# Patient Record
Sex: Male | Born: 1955 | Race: White | Hispanic: No | Marital: Married | Smoking: Never smoker
Health system: Southern US, Community
[De-identification: ages and names within clinical notes are randomized; demographics above are authoritative.]

## PROBLEM LIST (undated history)

## (undated) DIAGNOSIS — R972 Elevated prostate specific antigen [PSA]: Secondary | ICD-10-CM

## (undated) HISTORY — DX: Elevated prostate specific antigen (PSA): R97.20

## (undated) NOTE — Addendum Note (Signed)
 Addended by: BREEDLOVE, APRIL DAWN on: 08/27/2022 12:56 PM   Modules accepted: Orders   Electronically signed by April Stephane Sever, Tech at 08/27/2022 12:56 PM EDT

---

## 2010-04-05 ENCOUNTER — Ambulatory Visit: Payer: Self-pay | Admitting: Internal Medicine

## 2010-06-21 ENCOUNTER — Ambulatory Visit: Payer: Self-pay | Admitting: Urology

## 2010-12-23 ENCOUNTER — Ambulatory Visit: Payer: Self-pay | Admitting: Urology

## 2010-12-28 ENCOUNTER — Ambulatory Visit: Payer: Self-pay | Admitting: Urology

## 2011-07-09 ENCOUNTER — Ambulatory Visit: Payer: Self-pay | Admitting: Urology

## 2011-12-07 IMAGING — CT CT ABDOMEN W/ CM
2 of 3 series · 13 of 32 positions shown, 19 images · IV contrast (isovue)
Comparison: none

REASON FOR EXAM: Nephrolithiasis With Delays
COMMENTS:

PROCEDURE:     CT  - CT ABDOMEN STANDARD W  - June 21, 2010  [DATE]
RESULT:
TECHNIQUE: Helical 3 mm sections were obtained from the lung bases through
the superior iliac crest status post intravenous administration of 100 ml of
Isovue 370. Also included are delayed images. This study was compared to a
previous study dated 04/05/2010.

[Series 2: soft tissue · axial · 0.78mm/px · z∈[-640,-400]mm · 11 of 96 slices shown, 17 images]
[im 8/96  soft-tissue]
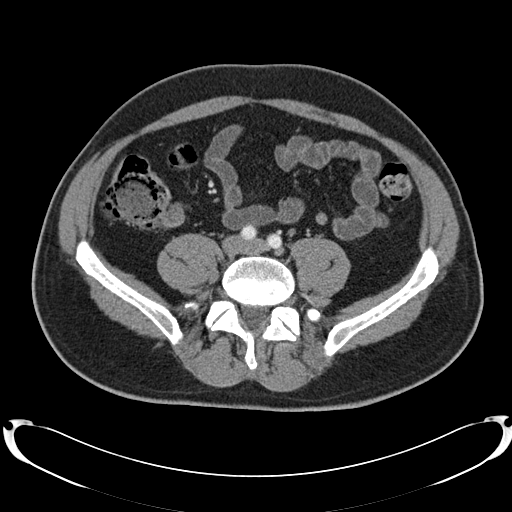
[im 8/96  bone]
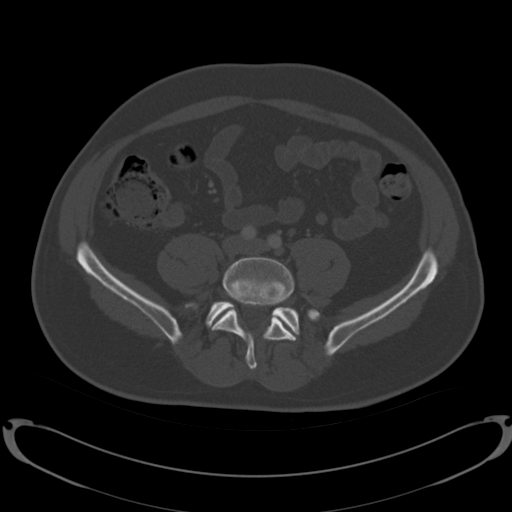
[im 16/96  soft-tissue]
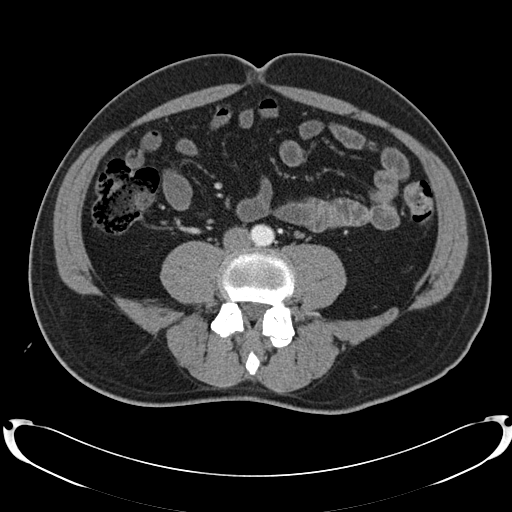
[im 24/96  soft-tissue]
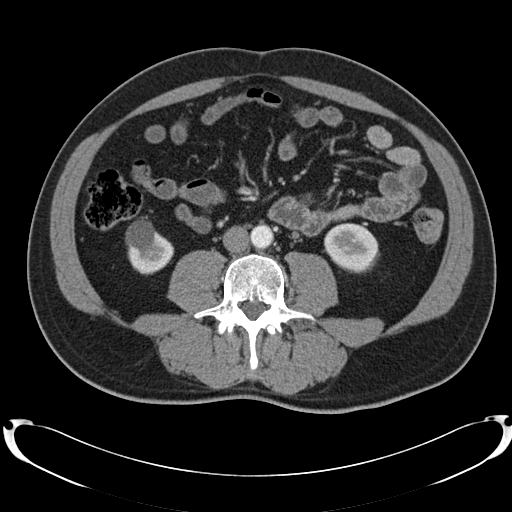
[im 32/96  soft-tissue]
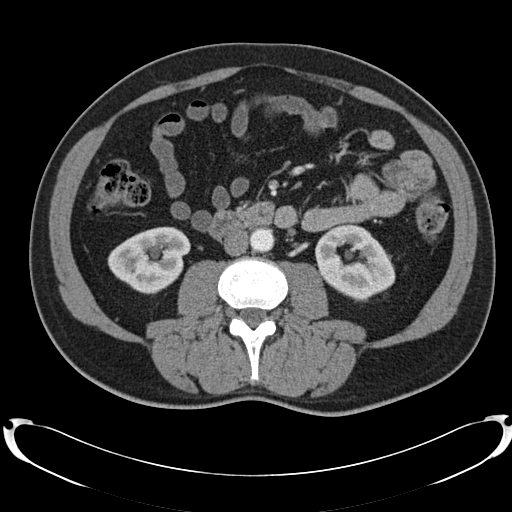
[im 40/96  soft-tissue]
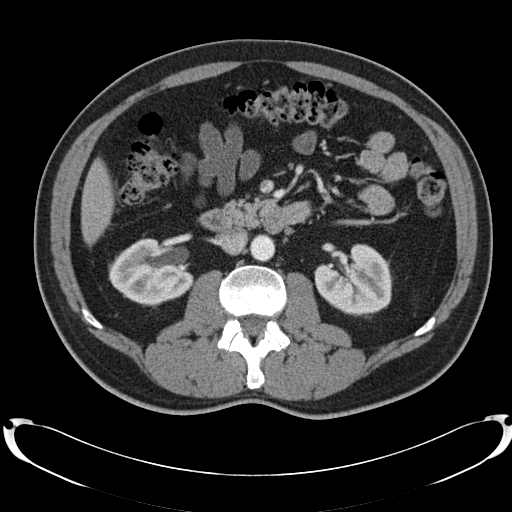
[im 48/96  soft-tissue]
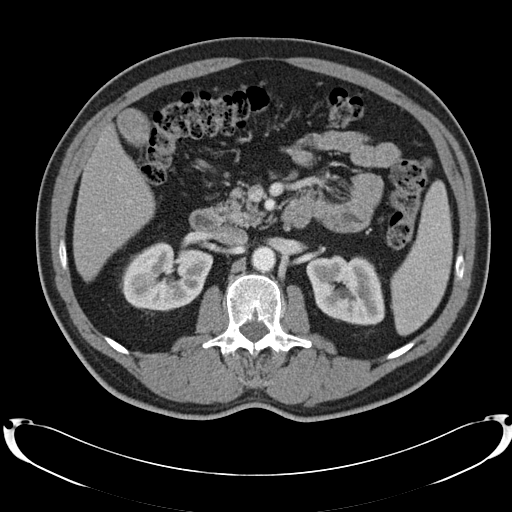
[im 56/96  soft-tissue]
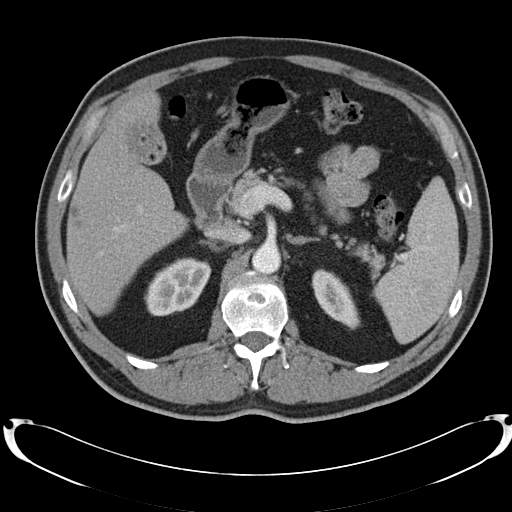
[im 64/96  soft-tissue]
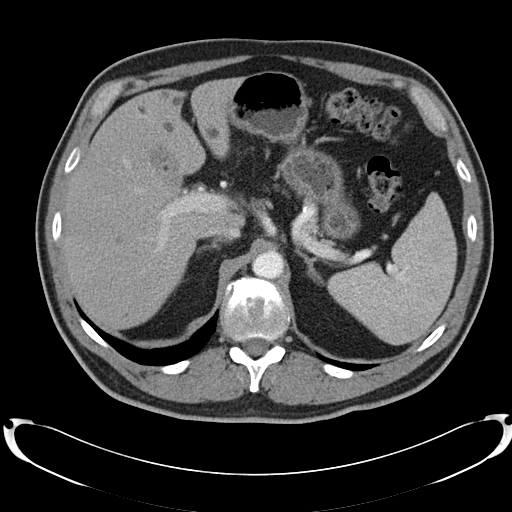
[im 64/96  lung]
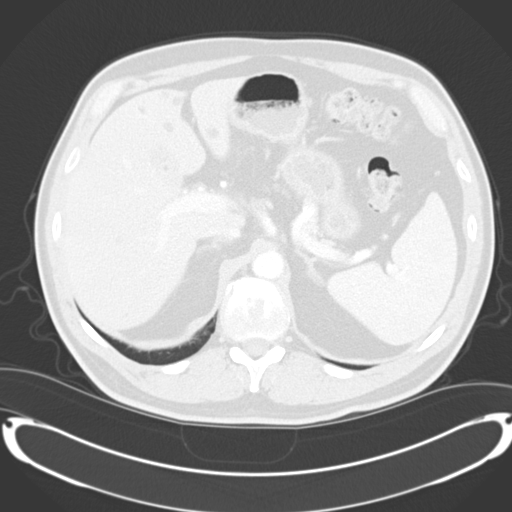
[im 72/96  soft-tissue]
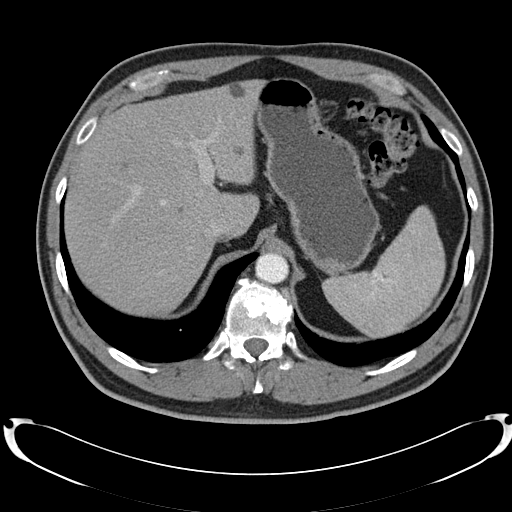
[im 72/96  lung]
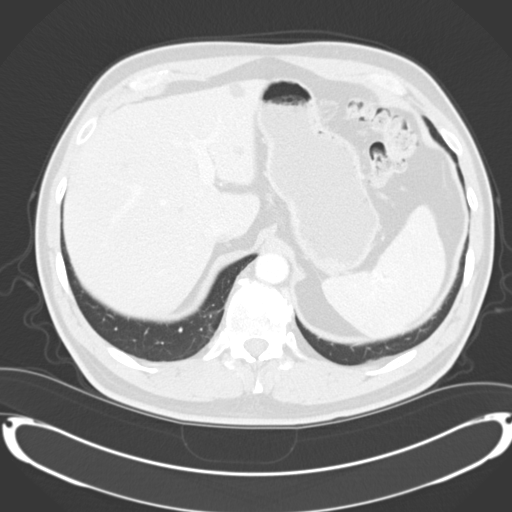
[im 72/96  bone]
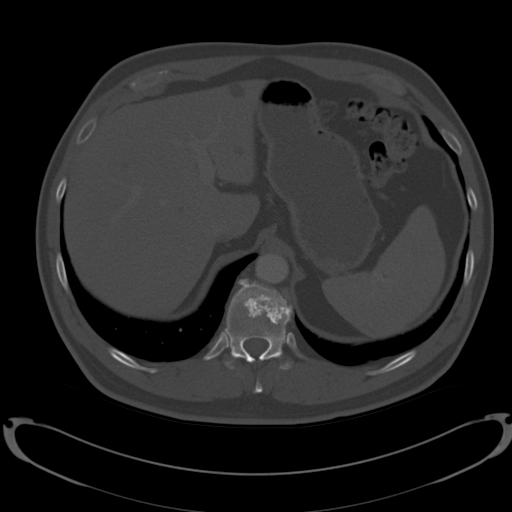
[im 80/96  soft-tissue]
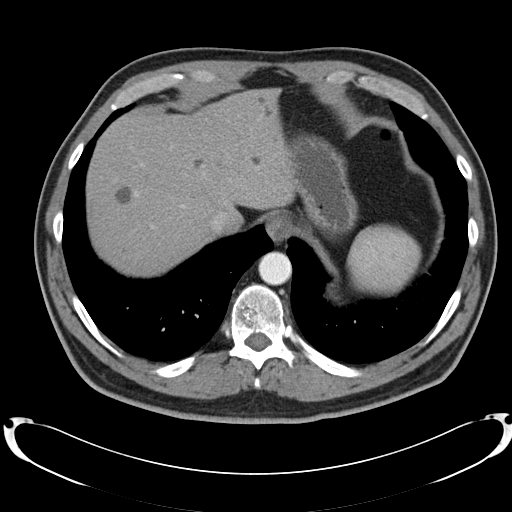
[im 80/96  lung]
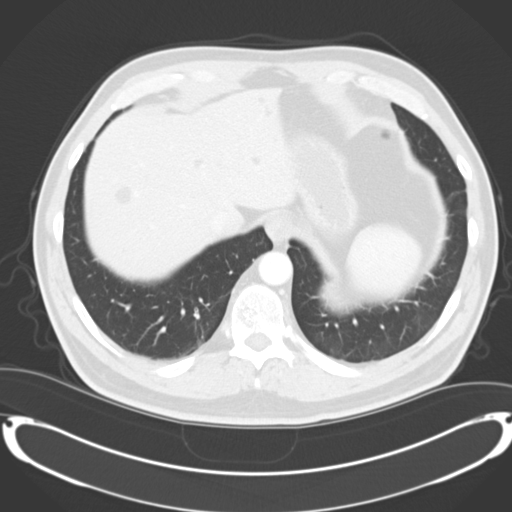
[im 88/96  soft-tissue]
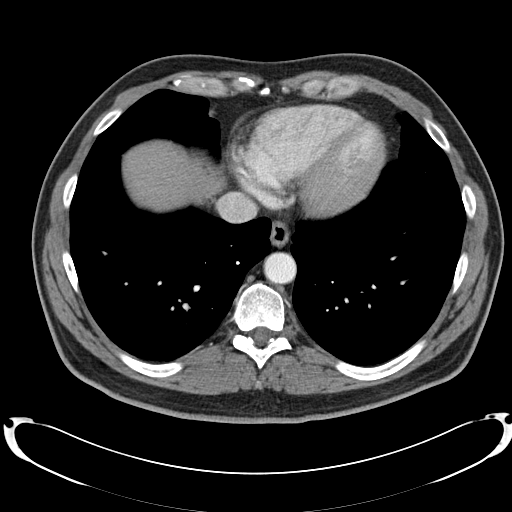
[im 88/96  lung]
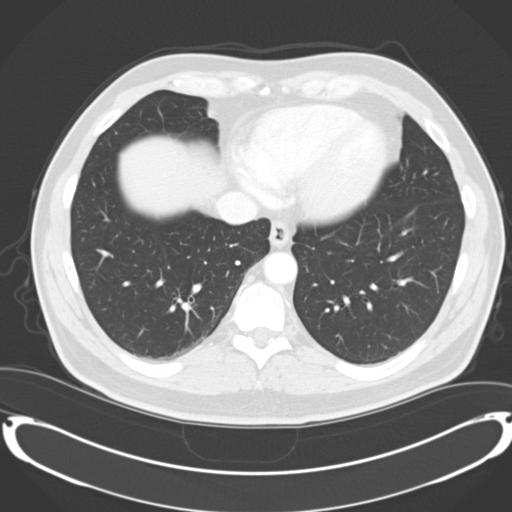

[Series 4: lung · axial · 0.78mm/px · z∈[-481,-454]mm · 2 of 44 slices shown]
[im 9/44  bone]
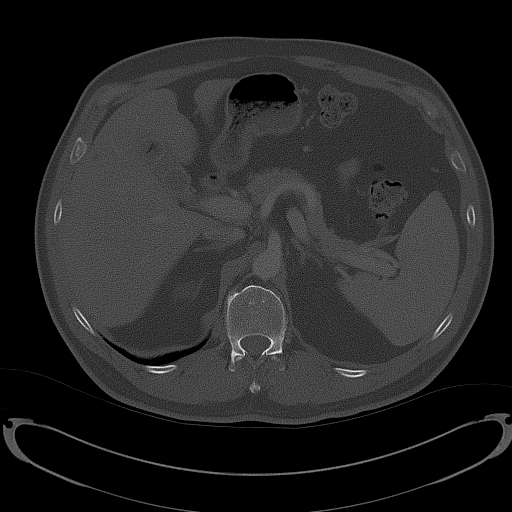
[im 18/44  bone]
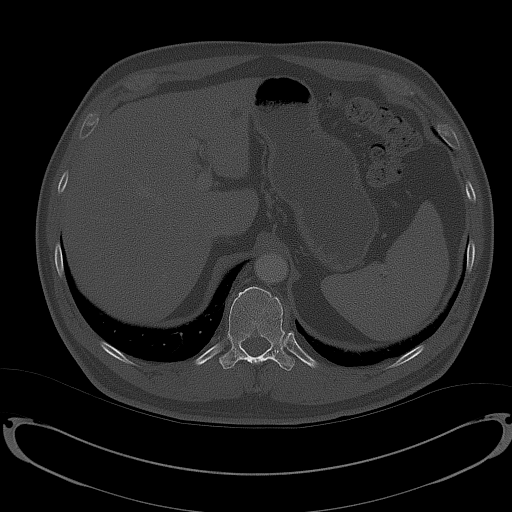

[13 of 32 positions shown; findings below may reference images not displayed]

FINDINGS: Evaluation of the lung bases demonstrates no gross abnormalities.
Evaluation of the liver demonstrates multiple low attenuating foci scattered
throughout the liver which are stable likely representing multiple cysts.
Partially calcified gallstone is identified within the gallbladder. The
spleen, adrenals, and pancreas is unremarkable. Evaluation of the kidneys
demonstrates a stable, low attenuating focus along the anterior lower pole
of the right kidney likely representing a cyst. There is no evidence of soft
tissue masses, hydronephrosis, hydroureter, nor evidence of nephrolithiasis
or ureterolithiasis within the visualized portion of the ureters. There is
no CT evidence of bowel obstruction, enteritis, colitis or evidence of
abdominal masses, free fluid or loculated fluid collections.
IMPRESSION: Stable cyst, right kidney, otherwise no CT evidence of
obstructive or inflammatory abnormalities.

## 2014-02-13 ENCOUNTER — Ambulatory Visit: Payer: Self-pay | Admitting: Ophthalmology

## 2014-08-19 NOTE — Op Note (Signed)
PATIENT NAME:  Thomas Hoffman, Thomas Hoffman MR#:  [id number] DATE OF BIRTH:  12-11-1955  DATE OF PROCEDURE:  02/13/2014  PREOPERATIVE DIAGNOSIS: Cataract, right eye.   POSTOPERATIVE DIAGNOSIS: Cataract, right eye.   PROCEDURE PERFORMED: Extracapsular cataract extraction using phacoemulsification with placement of Alcon [id number], 19.5 diopter posterior chamber lens, serial number [id number].   SURGEON:  Maylon PeppersSteven A. Kaylon Hitz, MD  ASSISTANT:  None.  ANESTHESIA: 4% lidocaine with 0.75% Marcaine, a 50-50 mixture with 10 units/mL of Hylenex added, given as a peribulbar.   ANESTHESIOLOGIST: Dr. Dimple Caseyice.   COMPLICATIONS: None.   ESTIMATED BLOOD LOSS: Less than 1 mL.   DESCRIPTION OF PROCEDURE:  The patient was brought to the operating room and given a peribulbar block.  The patient was then prepped and draped in the usual fashion.  The vertical rectus muscles were imbricated using 5-0 silk sutures.  These sutures were then clamped to the sterile drapes as bridle sutures.  A limbal peritomy was performed extending two clock hours and hemostasis was obtained with cautery.  A partial thickness scleral groove was made at the surgical limbus and dissected anteriorly in a lamellar dissection using an Alcon crescent knife.  The anterior chamber was entered superonasally with a Superblade and through the lamellar dissection with a 2.6 mm keratome.  DisCoVisc was used to replace the aqueous and a continuous tear capsulorrhexis was carried out.  Hydrodissection and hydrodelineation were carried out with balanced salt and a 27 gauge canula.  The nucleus was rotated to confirm the effectiveness of the hydrodissection.  Phacoemulsification was carried out using a divide-and-conquer technique.  Total ultrasound time was zero minutes and 46 seconds with an average power of 20.4 percent. SCD of 17.52. No suture was placed.   Irrigation/aspiration was used to remove the residual cortex.  DisCoVisc was used to inflate the capsule and  the internal incision was enlarged to 3 mm with the crescent knife.  The intraocular lens was folded and inserted into the capsular bag using the Alcon AcrySert delivery system. Irrigation/aspiration was used to remove the residual DisCoVisc.  Miostat was injected into the anterior chamber through the paracentesis track to inflate the anterior chamber and induce miosis.  A 0.1 mL of cefuroxime was injected via the paracentesis tract containing 1 mg of drug. The wound was checked for leaks and none were found. The conjunctiva was closed with cautery and the bridle sutures were removed.  Two drops of 0.3% Vigamox were placed on the eye.   An eye shield was placed on the eye.  The patient was discharged to the recovery room in good condition.   ____________________________ Maylon PeppersSteven A. Krystel Fletchall, MD sad:lt D: 02/13/2014 13:21:17 ET T: 02/13/2014 15:17:51 ET JOB#: [id number]  cc: Viviann SpareSteven A. Rayven Rettig, MD, <Dictator> Erline LevineSTEVEN A Jadyn Barge MD ELECTRONICALLY SIGNED 02/20/2014 12:38

## 2015-11-13 DIAGNOSIS — N281 Cyst of kidney, acquired: Secondary | ICD-10-CM | POA: Insufficient documentation

## 2015-11-13 DIAGNOSIS — K7689 Other specified diseases of liver: Secondary | ICD-10-CM | POA: Insufficient documentation

## 2015-11-14 DIAGNOSIS — D696 Thrombocytopenia, unspecified: Secondary | ICD-10-CM | POA: Insufficient documentation

## 2015-12-10 ENCOUNTER — Ambulatory Visit: Payer: BLUE CROSS/BLUE SHIELD | Admitting: Urology

## 2015-12-10 ENCOUNTER — Encounter: Payer: Self-pay | Admitting: Urology

## 2015-12-10 DIAGNOSIS — N200 Calculus of kidney: Secondary | ICD-10-CM | POA: Insufficient documentation

## 2015-12-10 DIAGNOSIS — R310 Gross hematuria: Secondary | ICD-10-CM | POA: Insufficient documentation

## 2015-12-10 DIAGNOSIS — Z125 Encounter for screening for malignant neoplasm of prostate: Secondary | ICD-10-CM | POA: Insufficient documentation

## 2016-04-14 ENCOUNTER — Encounter: Payer: Self-pay | Admitting: Urology

## 2016-04-14 ENCOUNTER — Ambulatory Visit: Payer: BLUE CROSS/BLUE SHIELD | Admitting: Urology

## 2016-04-14 VITALS — BP 149/82 | HR 62 | Ht 72.0 in | Wt 211.7 lb

## 2016-04-14 DIAGNOSIS — N200 Calculus of kidney: Secondary | ICD-10-CM

## 2016-04-14 DIAGNOSIS — Z125 Encounter for screening for malignant neoplasm of prostate: Secondary | ICD-10-CM | POA: Diagnosis not present

## 2016-04-14 DIAGNOSIS — R310 Gross hematuria: Secondary | ICD-10-CM

## 2016-04-14 LAB — URINALYSIS, COMPLETE
Bilirubin, UA: NEGATIVE
Glucose, UA: NEGATIVE
LEUKOCYTES UA: NEGATIVE
Nitrite, UA: NEGATIVE
PH UA: 5 (ref 5.0–7.5)
PROTEIN UA: NEGATIVE
Specific Gravity, UA: >1.03 — ABNORMAL HIGH (ref 1.005–1.030)
Urobilinogen, Ur: 0.2 mg/dL (ref 0.2–1.0)

## 2016-04-14 LAB — MICROSCOPIC EXAMINATION: EPITHELIAL CELLS (NON RENAL): NONE SEEN /HPF (ref 0–10)

## 2016-04-14 NOTE — Progress Notes (Signed)
04/14/2016 12:38 PM   Thomas Hoffman 07-06-1955 [id number]  Referring provider: Lynnea FerrierBert J Klein III, MD [address] [facility] [name]  CC: new patient with gross hematuria, nephrolithiasis, prostate screening  HPI:  1. Hematuria, gross - single episode at time of stone passage 2017. Had CT and cysto in [place], this has not been recurrent. Non snmoker.   2. Nephrolithiasis - ureteroscopy 07/2015 in [place] for 10mm UPJ stone. Had stent removal as well in [place]. No prior stones. Was told he is "stone free" following.    3. Prostate cancer screening - No FHX prostate cancer. 03/2016 DRE 30gm smooth / PSA (normal this year by PCP labs per pt)  PMH sig for basal cell skin CA, appy. No CV disease / blood thinners. 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  His [facility]CP is Thomas NonesBert Klein [facility]  Today "Thomas Hoffman" is seen as new patient for above. He is referred by Dr Thomas HusbandsKlein.   PMH: No past medical history on file.  Surgical History: No past surgical history on file.  Home Medications:  Allergies as of 04/14/2016   Not on File     Medication List    as of 04/14/2016 12:38 PM   You have not been prescribed any medications.     Allergies: Allergies not on file  Family History: No family history on file.  Social History:  has no tobacco, alcohol, and drug history on file.    Review of Systems  Gastrointestinal (upper)  : Negative for upper GI symptoms  Gastrointestinal (lower) : Negative for lower GI symptoms  Constitutional : Negative for symptoms  Skin: Negative for skin symptoms  Eyes: Negative for eye symptoms  Ear/Nose/Throat : Negative for Ear/Nose/Throat symptoms  Hematologic/Lymphatic: Negative for Hematologic/Lymphatic symptoms  Cardiovascular : Negative for cardiovascular symptoms  Respiratory : Negative for respiratory symptoms  Endocrine: Negative for endocrine  symptoms  Musculoskeletal: Negative for musculoskeletal symptoms  Neurological: Negative for neurological symptoms  Psychologic: Negative for psychiatric symptoms    Physical Exam: There were no vitals taken for this visit.  Constitutional:  Alert and oriented, No acute distress. HEENT: [place] AT, moist mucus membranes.  Trachea midline, no masses. Cardiovascular: No clubbing, cyanosis, or edema. Respiratory: Normal respiratory effort, no increased work of breathing. GI: Abdomen is soft, nontender, nondistended, no abdominal masses GU: No CVA tenderness. DRE 30gm smooth. No scrotal masses.  Skin: No rashes, bruises or suspicious lesions. Lymph: No cervical or inguinal adenopathy. Neurologic: Grossly intact, no focal deficits, moving all 4 extremities. Psychiatric: Normal mood and affect.  Laboratory Data: No results found for: WBC, HGB, HCT, MCV, PLT  No results found for: CREATININE  No results found for: PSA  No results found for: TESTOSTERONE  No results found for: HGBA1C  Urinalysis No results found for: COLORURINE, APPEARANCEUR, LABSPEC, PHURINE, GLUCOSEU, HGBUR, BILIRUBINUR, KETONESUR, PROTEINUR, UROBILINOGEN, NITRITE, LEUKOCYTESUR  Pertinent Imaging: none to review  Assessment & Plan:    1. Hematuria, gross - low risk and from prior nephrolithiasis. STORNGLY reinforced importance of repeat eval should this be recurrent.   2. Nephrolithiasis - discussed future recurrence risk approx 50% after single episode colic. He has no planned 3rd world travel. Discussed that I typically do not recommend exhaustive metabolic eval and annual surveillance after single episode colic, but we would proceed with much more aggressive approach should this become recurrent. We both agree on no further eval at this time.  3. Prostate cancer screening - DRE today reassuring and PSA normal this year per report, continue until age 61 or so as average risk.    Thomas AcheMANNY, Thomas Farewell,  MD  [facility] [address], [address] [place], [place] [address] [phone]

## 2018-01-21 ENCOUNTER — Ambulatory Visit: Payer: Self-pay

## 2018-01-21 DIAGNOSIS — Z0000 Encounter for general adult medical examination without abnormal findings: Secondary | ICD-10-CM

## 2018-03-09 ENCOUNTER — Ambulatory Visit: Payer: Self-pay | Admitting: Medical

## 2018-03-09 ENCOUNTER — Encounter: Payer: Self-pay | Admitting: Medical

## 2018-03-09 VITALS — BP 144/84 | HR 90 | Temp 97.5°F | Resp 18 | Wt 214.6 lb

## 2018-03-09 DIAGNOSIS — R05 Cough: Secondary | ICD-10-CM

## 2018-03-09 DIAGNOSIS — J0110 Acute frontal sinusitis, unspecified: Secondary | ICD-10-CM

## 2018-03-09 DIAGNOSIS — R059 Cough, unspecified: Secondary | ICD-10-CM

## 2018-03-09 MED ORDER — AMOXICILLIN-POT CLAVULANATE 875-125 MG PO TABS: 1.0000 | ORAL_TABLET | Freq: Two times a day (BID) | ORAL | 0 refills | Status: DC

## 2018-03-09 MED ORDER — BENZONATATE 100 MG PO CAPS: 100.0000 mg | ORAL_CAPSULE | Freq: Three times a day (TID) | ORAL | 0 refills | Status: DC | PRN

## 2018-03-09 NOTE — Patient Instructions (Signed)
Amoxicillin; Clavulanic Acid tablets What is this medicine? AMOXICILLIN; CLAVULANIC ACID (a mox i SIL in; KLAV yoo lan ic AS id) is a penicillin antibiotic. It is used to treat certain kinds of bacterial infections. It will not work for colds, flu, or other viral infections. This medicine may be used for other purposes; ask your health care provider or pharmacist if you have questions. COMMON BRAND NAME(S): Augmentin What should I tell my health care provider before I take this medicine? They need to know if you have any of these conditions: -bowel disease, like colitis -kidney disease -liver disease -mononucleosis -an unusual or allergic reaction to amoxicillin, penicillin, cephalosporin, other antibiotics, clavulanic acid, other medicines, foods, dyes, or preservatives -pregnant or trying to get pregnant -breast-feeding How should I use this medicine? Take this medicine by mouth with a full glass of water. Follow the directions on the prescription label. Take at the start of a meal. Do not crush or chew. If the tablet has a score line, you may cut it in half at the score line for easier swallowing. Take your medicine at regular intervals. Do not take your medicine more often than directed. Take all of your medicine as directed even if you think you are better. Do not skip doses or stop your medicine early. Talk to your pediatrician regarding the use of this medicine in children. Special care may be needed. Overdosage: If you think you have taken too much of this medicine contact a poison control center or emergency room at once. NOTE: This medicine is only for you. Do not share this medicine with others. What if I miss a dose? If you miss a dose, take it as soon as you can. If it is almost time for your next dose, take only that dose. Do not take double or extra doses. What may interact with this medicine? -allopurinol -anticoagulants -birth control pills -methotrexate -probenecid This  list may not describe all possible interactions. Give your health care provider a list of all the medicines, herbs, non-prescription drugs, or dietary supplements you use. Also tell them if you smoke, drink alcohol, or use illegal drugs. Some items may interact with your medicine. What should I watch for while using this medicine? Tell your doctor or health care professional if your symptoms do not improve. Do not treat diarrhea with over the counter products. Contact your doctor if you have diarrhea that lasts more than 2 days or if it is severe and watery. If you have diabetes, you may get a false-positive result for sugar in your urine. Check with your doctor or health care professional. Birth control pills may not work properly while you are taking this medicine. Talk to your doctor about using an extra method of birth control. What side effects may I notice from receiving this medicine? Side effects that you should report to your doctor or health care professional as soon as possible: -allergic reactions like skin rash, itching or hives, swelling of the face, lips, or tongue -breathing problems -dark urine -fever or chills, sore throat -redness, blistering, peeling or loosening of the skin, including inside the mouth -seizures -trouble passing urine or change in the amount of urine -unusual bleeding, bruising -unusually weak or tired -white patches or sores in the mouth or throat Side effects that usually do not require medical attention (report to your doctor or health care professional if they continue or are bothersome): -diarrhea -dizziness -headache -nausea, vomiting -stomach upset -vaginal or anal irritation This list may   not describe all possible side effects. Call your doctor for medical advice about side effects. You may report side effects to FDA at 1-800-FDA-1088. Where should I keep my medicine? Keep out of the reach of children. Store at room temperature below 25 degrees  C (77 degrees F). Keep container tightly closed. Throw away any unused medicine after the expiration date. NOTE: This sheet is a summary. It may not cover all possible information. If you have questions about this medicine, talk to your doctor, pharmacist, or health care provider.  2018 Elsevier/Gold Standard (2007-07-08 12:04:30)  Sinusitis, Adult Sinusitis is soreness and inflammation of your sinuses. Sinuses are hollow spaces in the bones around your face. They are located:  Around your eyes.  In the middle of your forehead.  Behind your nose.  In your cheekbones.  Your sinuses and nasal passages are lined with a stringy fluid (mucus). Mucus normally drains out of your sinuses. When your nasal tissues get inflamed or swollen, the mucus can get trapped or blocked so air cannot flow through your sinuses. This lets bacteria, viruses, and funguses grow, and that leads to infection. Follow these instructions at home: Medicines  Take, use, or apply over-the-counter and prescription medicines only as told by your doctor. These may include nasal sprays.  If you were prescribed an antibiotic medicine, take it as told by your doctor. Do not stop taking the antibiotic even if you start to feel better. Hydrate and Humidify  Drink enough water to keep your pee (urine) clear or pale yellow.  Use a cool mist humidifier to keep the humidity level in your home above 50%.  Breathe in steam for 10-15 minutes, 3-4 times a day or as told by your doctor. You can do this in the bathroom while a hot shower is running.  Try not to spend time in cool or dry air. Rest  Rest as much as possible.  Sleep with your head raised (elevated).  Make sure to get enough sleep each night. General instructions  Put a warm, moist washcloth on your face 3-4 times a day or as told by your doctor. This will help with discomfort.  Wash your hands often with soap and water. If there is no soap and water, use hand  sanitizer.  Do not smoke. Avoid being around people who are smoking (secondhand smoke).  Keep all follow-up visits as told by your doctor. This is important. Contact a doctor if:  You have a fever.  Your symptoms get worse.  Your symptoms do not get better within 10 days. Get help right away if:  You have a very bad headache.  You cannot stop throwing up (vomiting).  You have pain or swelling around your face or eyes.  You have trouble seeing.  You feel confused.  Your neck is stiff.  You have trouble breathing. This information is not intended to replace advice given to you by your health care provider. Make sure you discuss any questions you have with your health care provider. Document Released: 10/01/2007 Document Revised: 12/09/2015 Document Reviewed: 02/07/2015 Elsevier Interactive Patient Education  2018 Elsevier Inc.  

## 2018-03-09 NOTE — Progress Notes (Signed)
   Subjective:    Patient ID: Thomas Hoffman, male    DOB: [birth year], 62 y.o.   MRN: [id number]  HPI 62 yo male in non acute distress. Comes in today with complaints of  10 days , sore throat, cough non productive, nasal congestion, runny nose mostly clear. Fever and Chills on/off, Denies shortness of breath and Chest pain with coughing. Cough keeping him up at night . Has  Tried Sudafed and Zyrtec.  Wife with the flu and and URI, last week, was treated with antibiotics.   Blood pressure (!) 144/84, pulse 90, temperature (!) 97.5 F (36.4 C), temperature source Tympanic, resp. rate 18, weight 214 lb 9.6 oz (97.3 kg), SpO2 98 %.  Review of Systems  Constitutional: Positive for chills, fatigue and fever (did not take).  HENT: Positive for congestion, postnasal drip, rhinorrhea, sinus pressure ("my temproral area bilaterally), sinus pain, sore throat and voice change. Negative for ear pain and sneezing.   Eyes: Negative for discharge and itching.  Respiratory: Positive for cough and chest tightness. Negative for shortness of breath and wheezing.   Cardiovascular: Positive for chest pain (with coughint).  Gastrointestinal: Negative for abdominal pain.  Endocrine: Negative for polydipsia, polyphagia and polyuria.  Genitourinary: Negative for dysuria.  Musculoskeletal: Negative for myalgias.  Skin: Negative for rash.  Allergic/Immunologic: Negative for environmental allergies and food allergies.  Neurological: Positive for headaches. Negative for dizziness, syncope and light-headedness.  Hematological: Negative for adenopathy.  Psychiatric/Behavioral: Negative for behavioral problems, confusion, self-injury and suicidal ideas.       Objective:   Physical Exam  Constitutional: Thomas Hoffman is oriented to person, place, and time. Thomas Hoffman appears well-developed and well-nourished.  HENT:  Head: Normocephalic and atraumatic.  Right Ear: Hearing, external ear and ear canal normal. A middle ear effusion is  present.  Left Ear: Hearing, external ear and ear canal normal. A middle ear effusion is present.  Nose: Mucosal edema and rhinorrhea present.  Mouth/Throat: Uvula is midline and mucous membranes are normal. Posterior oropharyngeal edema (mild erythema) present. Tonsils are 1+ on the left.  Eyes: Pupils are equal, round, and reactive to light. Conjunctivae and EOM are normal.  Neck: Normal range of motion. Neck supple.  Cardiovascular: Normal rate, regular rhythm and normal heart sounds.  Pulmonary/Chest: Effort normal and breath sounds normal.  Neurological: Thomas Hoffman is alert and oriented to person, place, and time.  Skin: Skin is warm and dry.  Psychiatric: Thomas Hoffman has a normal mood and affect. His behavior is normal. Judgment and thought content normal.  Nursing note and vitals reviewed.         Assessment & Plan:  Sinusitis Cough Pharyngitis Meds ordered this encounter  Medications  . amoxicillin-clavulanate (AUGMENTIN) 875-125 MG tablet    Sig: Take 1 tablet by mouth 2 (two) times daily.    Dispense:  20 tablet    Refill:  0  . benzonatate (TESSALON PERLES) 100 MG capsule    Sig: Take 1 capsule (100 mg total) by mouth 3 (three) times daily as needed.    Dispense:  30 capsule    Refill:  0  Rest , increase fluids, OTC Motrin or Tylenol as needed , take as directed. Salt water gargles 3 times per day.  OTC Flonase NS take as directed for post nasl drip and Eustachian tube dysfunction bilaterally. Return in 3-5 days if not improving or worsening. Patient declined AVS. Patient verbalizes understanding and has no questions at discharge. .Marland Kitchen

## 2018-03-19 ENCOUNTER — Ambulatory Visit: Payer: Self-pay | Admitting: Adult Health

## 2018-03-19 ENCOUNTER — Encounter: Payer: Self-pay | Admitting: Adult Health

## 2018-03-19 VITALS — BP 135/92 | HR 62 | Temp 98.1°F | Resp 16 | Ht 72.0 in | Wt 215.0 lb

## 2018-03-19 DIAGNOSIS — R059 Cough, unspecified: Secondary | ICD-10-CM

## 2018-03-19 DIAGNOSIS — J40 Bronchitis, not specified as acute or chronic: Secondary | ICD-10-CM

## 2018-03-19 DIAGNOSIS — K8020 Calculus of gallbladder without cholecystitis without obstruction: Secondary | ICD-10-CM | POA: Insufficient documentation

## 2018-03-19 DIAGNOSIS — E785 Hyperlipidemia, unspecified: Secondary | ICD-10-CM | POA: Insufficient documentation

## 2018-03-19 DIAGNOSIS — Z85828 Personal history of other malignant neoplasm of skin: Secondary | ICD-10-CM | POA: Insufficient documentation

## 2018-03-19 DIAGNOSIS — R3129 Other microscopic hematuria: Secondary | ICD-10-CM | POA: Insufficient documentation

## 2018-03-19 DIAGNOSIS — R05 Cough: Secondary | ICD-10-CM

## 2018-03-19 MED ORDER — PREDNISONE 10 MG (21) PO TBPK: ORAL_TABLET | ORAL | 0 refills | Status: DC

## 2018-03-19 MED ORDER — AZITHROMYCIN 250 MG PO TABS: ORAL_TABLET | ORAL | 0 refills | Status: DC

## 2018-03-19 NOTE — Progress Notes (Addendum)
Subjective:     Patient ID: Thomas Hoffman, male   DOB: [birth year], 62 y.o.   MRN: [id number]  HPI   Blood pressure (!) 135/92, pulse 62, temperature 98.1 F (36.7 C), resp. rate 16, height 6' (1.829 m), weight 215 lb (97.5 kg), SpO2 97 %. Taking Sudafed.    Patient is a 62 year old male in no acute distress here for follow up he reports he is feeling better than he was, but does have post nasal drip that continues and a new chest congestion.Since last office visit he has started Zyrtec and Flonase everyday. Mild fatigue.  He also completed Antibiotics Augmentin. He has still been using Tessalon pearles.   He continues to feel as if he has sinus pressure/ drainage but is improved from previously. Now has mild bronchial congestion and mild chest tightness. Coughing up yellow sputum- scant.  Mild fatigue.  Wife has had pneumonia x 2 in the past few months. She is a Engineer, civil (consulting)nurse. She was not hospitalized.   He denies smoking, vaping. Denies ever using tobacco products.   Patient  denies any fever, chills, rash, chest pain, shortness of breath, nausea, vomiting, or diarrhea.  Reports he sees his primary care regularly. Reviewed problem list and medications with patient.   Denies any edema.  Review of Systems  HENT: Positive for congestion, postnasal drip, rhinorrhea, sinus pressure and sore throat. Negative for sneezing.   Respiratory: Positive for cough and chest tightness. Negative for apnea, choking, shortness of breath, wheezing and stridor.   Cardiovascular: Negative.   Gastrointestinal: Negative.   Genitourinary: Negative.   Musculoskeletal: Negative.   Skin: Negative.   Neurological: Negative.   Hematological: Negative.   Psychiatric/Behavioral: Negative.        Objective:   Physical Exam  Constitutional: He is oriented to person, place, and time. He appears well-developed and well-nourished.  Patient is alert and oriented and responsive to questions Engages in eye contact with  provider. Speaks in full sentences without any pauses without any shortness of breath or distress.    HENT:  Head: Normocephalic and atraumatic.  Right Ear: Hearing, external ear and ear canal normal. Tympanic membrane is erythematous. Tympanic membrane is not perforated. A middle ear effusion is present.  Left Ear: Hearing, external ear and ear canal normal. Tympanic membrane is erythematous. Tympanic membrane is not perforated. A middle ear effusion is present.  Nose: Rhinorrhea present. No mucosal edema. Right sinus exhibits no maxillary sinus tenderness and no frontal sinus tenderness. Left sinus exhibits no maxillary sinus tenderness and no frontal sinus tenderness.  Mouth/Throat: Uvula is midline and mucous membranes are normal. No uvula swelling. Posterior oropharyngeal erythema present. No oropharyngeal exudate, posterior oropharyngeal edema or tonsillar abscesses. No tonsillar exudate.  Eyes: Pupils are equal, round, and reactive to light. Conjunctivae, EOM and lids are normal. Right eye exhibits no discharge. Left eye exhibits no discharge. No scleral icterus.  Neck: Trachea normal, normal range of motion, full passive range of motion without pain and phonation normal. Neck supple. Normal carotid pulses and no JVD present. No tracheal tenderness present. Carotid bruit is not present. No tracheal deviation present. No Brudzinski's sign noted. No thyromegaly present.  Cardiovascular: Normal rate, regular rhythm, normal heart sounds and intact distal pulses. Exam reveals no gallop and no friction rub.  No murmur heard. Pulmonary/Chest: Effort normal. No accessory muscle usage or stridor. No tachypnea and no bradypnea. No respiratory distress. He has no decreased breath sounds. He has wheezes (mild bilateral expiratory  wheeze ) in the right upper field and the left upper field. He has no rhonchi. He has no rales. He exhibits no tenderness.  bronchial congestion noted in room and by auscultation   Abdominal: Soft. Bowel sounds are normal.  Musculoskeletal: Normal range of motion.  Lymphadenopathy:       Head (right side): No submental, no submandibular, no tonsillar, no preauricular, no posterior auricular and no occipital adenopathy present.       Head (left side): No submental, no submandibular, no tonsillar, no preauricular, no posterior auricular and no occipital adenopathy present.    He has no cervical adenopathy.  Neurological: He is alert and oriented to person, place, and time.  Skin: Skin is warm and dry. Capillary refill takes less than 2 seconds. No rash noted. There is erythema. No pallor.  Vitals reviewed.      Assessment:     Cough - Plan: DG Chest 2 View  Acute non-recurrent frontal sinusitis  Bronchitis - Plan: DG Chest 2 View      Plan:     Meds ordered this encounter  Medications  . predniSONE (STERAPRED UNI-PAK 21 TAB) 10 MG (21) TBPK tablet    Sig: PO: Take 6 tablets on day 1:Take 5 tablets day 2:Take 4 tablets day 3: Take 3 tablets day 4:Take 2 tablets day five: 5 Take 1 tablet day 6    Dispense:  21 tablet    Refill:  0  . azithromycin (ZITHROMAX) 250 MG tablet    Sig: By mouth Take 2 tablets day 1 (500mg  total) and 1 tablet ( 250 mg ) on days 2,3,4,5.    Dispense:  6 tablet    Refill:  0   Will treat for atypical bacteria given wife recent history and hospital exposure - ordered chest x ray rule out pneumonia.   Keep record of blood pressure for your PCP. Avoid sudafed.  Return in about 1 week (around 03/26/2018) for Call 911 for emergencies, Go to Emergency room/ urgent care if worse.  Orders Placed This Encounter  Procedures  . DG Chest 2 View    Standing Status:   Future    Standing Expiration Date:   04/18/2018    Order Specific Question:   Reason for Exam (SYMPTOM  OR DIAGNOSIS REQUIRED)    Answer:   cough persistent rule out pneumonia since 03/09/18    Order Specific Question:   Preferred imaging location?    Answer:   [facility]    Order Specific Question:   Call Results- Best Contact Number?    Answer:   [phone]    Order Specific Question:   Radiology Contrast Protocol - do NOT remove file path    Answer:   [email or web address]   Schedule follow up appointment with your primary care for your annual physical and labs when due and follow up.   Advised patient call the office or your primary care doctor for an appointment if no improvement within 72 hours or if any symptoms change or worsen at any time  Advised ER or urgent Care if after hours or on weekend. Call 911 for emergency symptoms at any time.Patinet verbalized understanding of all instructions given/reviewed and treatment plan and has no further questions or concerns at this time.    Patient verbalized understanding of all instructions given and denies any further questions at this time.

## 2018-03-19 NOTE — Patient Instructions (Signed)
Acute Bronchitis, Adult Acute bronchitis is when air tubes (bronchi) in the lungs suddenly get swollen. The condition can make it hard to breathe. It can also cause these symptoms:  A cough.  Coughing up clear, yellow, or green mucus.  Wheezing.  Chest congestion.  Shortness of breath.  A fever.  Body aches.  Chills.  A sore throat.  Follow these instructions at home: Medicines  Take over-the-counter and prescription medicines only as told by your doctor.  If you were prescribed an antibiotic medicine, take it as told by your doctor. Do not stop taking the antibiotic even if you start to feel better. General instructions  Rest.  Drink enough fluids to keep your pee (urine) clear or pale yellow.  Avoid smoking and secondhand smoke. If you smoke and you need help quitting, ask your doctor. Quitting will help your lungs heal faster.  Use an inhaler, cool mist vaporizer, or humidifier as told by your doctor.  Keep all follow-up visits as told by your doctor. This is important. How is this prevented? To lower your risk of getting this condition again:  Wash your hands often with soap and water. If you cannot use soap and water, use hand sanitizer.  Avoid contact with people who have cold symptoms.  Try not to touch your hands to your mouth, nose, or eyes.  Make sure to get the flu shot every year.  Contact a doctor if:  Your symptoms do not get better in 2 weeks. Get help right away if:  You cough up blood.  You have chest pain.  You have very bad shortness of breath.  You become dehydrated.  You faint (pass out) or keep feeling like you are going to pass out.  You keep throwing up (vomiting).  You have a very bad headache.  Your fever or chills gets worse. This information is not intended to replace advice given to you by your health care provider. Make sure you discuss any questions you have with your health care provider. Document Released:  10/01/2007 Document Revised: 11/21/2015 Document Reviewed: 10/03/2015 Elsevier Interactive Patient Education  2018 [employer]. Cough, Adult A cough helps to clear your throat and lungs. A cough may last only 2-3 weeks (acute), or it may last longer than 8 weeks (chronic). Many different things can cause a cough. A cough may be a sign of an illness or another medical condition. Follow these instructions at home:  Pay attention to any changes in your cough.  Take medicines only as told by your doctor. ? If you were prescribed an antibiotic medicine, take it as told by your doctor. Do not stop taking it even if you start to feel better. ? Talk with your doctor before you try using a cough medicine.  Drink enough fluid to keep your pee (urine) clear or pale yellow.  If the air is dry, use a cold steam vaporizer or humidifier in your home.  Stay away from things that make you cough at work or at home.  If your cough is worse at night, try using extra pillows to raise your head up higher while you sleep.  Do not smoke, and try not to be around smoke. If you need help quitting, ask your doctor.  Do not have caffeine.  Do not drink alcohol.  Rest as needed. Contact a doctor if:  You have new problems (symptoms).  You cough up yellow fluid (pus).  Your cough does not get better after 2-3 weeks, or   your cough gets worse.  Medicine does not help your cough and you are not sleeping well.  You have pain that gets worse or pain that is not helped with medicine.  You have a fever.  You are losing weight and you do not know why.  You have night sweats. Get help right away if:  You cough up blood.  You have trouble breathing.  Your heartbeat is very fast. This information is not intended to replace advice given to you by your health care provider. Make sure you discuss any questions you have with your health care provider. Document Released: 12/26/2010 Document Revised:  09/20/2015 Document Reviewed: 06/21/2014 Elsevier Interactive Patient Education  2018 [employer]. Otitis Media, Adult Otitis media is redness, soreness, and puffiness (swelling) in the space just behind your eardrum (middle ear). It may be caused by allergies or infection. It often happens along with a cold. Follow these instructions at home:  Take your medicine as told. Finish it even if you start to feel better.  Only take over-the-counter or prescription medicines for pain, discomfort, or fever as told by your doctor.  Follow up with your doctor as told. Contact a doctor if:  You have otitis media only in one ear, or bleeding from your nose, or both.  You notice a lump on your neck.  You are not getting better in 3-5 days.  You feel worse instead of better. Get help right away if:  You have pain that is not helped with medicine.  You have puffiness, redness, or pain around your ear.  You get a stiff neck.  You cannot move part of your face (paralysis).  You notice that the bone behind your ear hurts when you touch it. This information is not intended to replace advice given to you by your health care provider. Make sure you discuss any questions you have with your health care provider. Document Released: 10/01/2007 Document Revised: 09/20/2015 Document Reviewed: 11/09/2012 Elsevier Interactive Patient Education  2017 Else[employer] Inc. Azithromycin tablets What is this medicine? AZITHROMYCIN (az ith roe MYE sin) is a macrolide antibiotic. It is used to treat or prevent certain kinds of bacterial infections. It will not work for colds, flu, or other viral infections. This medicine may be used for other purposes; ask your health care provider or pharmacist if you have questions. COMMON BRAND NAME(S): Zithromax, Zithromax Tri-Pak, Zithromax Z-Pak What should I tell my health care provider before I take this medicine? They need to know if you have any of these  conditions: -kidney disease -liver disease -irregular heartbeat or heart disease -an unusual or allergic reaction to azithromycin, erythromycin, other macrolide antibiotics, foods, dyes, or preservatives -pregnant or trying to get pregnant -breast-feeding How should I use this medicine? Take this medicine by mouth with a full glass of water. Follow the directions on the prescription label. The tablets can be taken with food or on an empty stomach. If the medicine upsets your stomach, take it with food. Take your medicine at regular intervals. Do not take your medicine more often than directed. Take all of your medicine as directed even if you think your are better. Do not skip doses or stop your medicine early. Talk to your pediatrician regarding the use of this medicine in children. While this drug may be prescribed for children as young as 6 months for selected conditions, precautions do apply. Overdosage: If you think you have taken too much of this medicine contact a poison control center or  emergency room at once. NOTE: This medicine is only for you. Do not share this medicine with others. What if I miss a dose? If you miss a dose, take it as soon as you can. If it is almost time for your next dose, take only that dose. Do not take double or extra doses. What may interact with this medicine? Do not take this medicine with any of the following medications: -lincomycin This medicine may also interact with the following medications: -amiodarone -antacids -birth control pills -cyclosporine -digoxin -magnesium -nelfinavir -phenytoin -warfarin This list may not describe all possible interactions. Give your health care provider a list of all the medicines, herbs, non-prescription drugs, or dietary supplements you use. Also tell them if you smoke, drink alcohol, or use illegal drugs. Some items may interact with your medicine. What should I watch for while using this medicine? Tell your  doctor or healthcare professional if your symptoms do not start to get better or if they get worse. Do not treat diarrhea with over the counter products. Contact your doctor if you have diarrhea that lasts more than 2 days or if it is severe and watery. This medicine can make you more sensitive to the sun. Keep out of the sun. If you cannot avoid being in the sun, wear protective clothing and use sunscreen. Do not use sun lamps or tanning beds/booths. What side effects may I notice from receiving this medicine? Side effects that you should report to your doctor or health care professional as soon as possible: -allergic reactions like skin rash, itching or hives, swelling of the face, lips, or tongue -confusion, nightmares or hallucinations -dark urine -difficulty breathing -hearing loss -irregular heartbeat or chest pain -pain or difficulty passing urine -redness, blistering, peeling or loosening of the skin, including inside the mouth -white patches or sores in the mouth -yellowing of the eyes or skin Side effects that usually do not require medical attention (report to your doctor or health care professional if they continue or are bothersome): -diarrhea -dizziness, drowsiness -headache -stomach upset or vomiting -tooth discoloration -vaginal irritation This list may not describe all possible side effects. Call your doctor for medical advice about side effects. You may report side effects to FDA at 1-800-FDA-1088. Where should I keep my medicine? Keep out of the reach of children. Store at room temperature between 15 and 30 degrees C (59 and 86 degrees F). Throw away any unused medicine after the expiration date. NOTE: This sheet is a summary. It may not cover all possible information. If you have questions about this medicine, talk to your doctor, pharmacist, or health care provider.  2018 Elsevier/Gold Standard (2015-06-12 15:26:03) Prednisolone tablets What is this  medicine? PREDNISOLONE (pred NISS oh lone) is a corticosteroid. It is commonly used to treat inflammation of the skin, joints, lungs, and other organs. Common conditions treated include asthma, allergies, and arthritis. It is also used for other conditions, such as blood disorders and diseases of the adrenal glands. This medicine may be used for other purposes; ask your health care provider or pharmacist if you have questions. COMMON BRAND NAME(S): Millipred, Millipred DP, Millipred DP 12-Day, Millipred DP 6 Day, Prednoral What should I tell my health care provider before I take this medicine? They need to know if you have any of these conditions: -Cushing's syndrome -diabetes -glaucoma -heart problems or disease -high blood pressure -infection such as herpes, measles, tuberculosis, or chickenpox -kidney disease -liver disease -mental problems -myasthenia gravis -osteoporosis -seizures -stomach ulcer  or intestine disease including colitis and diverticulitis -thyroid problem -an unusual or allergic reaction to lactose, prednisolone, other medicines, foods, dyes, or preservatives -pregnant or trying to get pregnant -breast-feeding How should I use this medicine? Take this medicine by mouth with a glass of water. Follow the directions on the prescription label. Take it with food or milk to avoid stomach upset. If you are taking this medicine once a day, take it in the morning. Do not take more medicine than you are told to take. Do not suddenly stop taking your medicine because you may develop a severe reaction. Your doctor will tell you how much medicine to take. If your doctor wants you to stop the medicine, the dose may be slowly lowered over time to avoid any side effects. Talk to your pediatrician regarding the use of this medicine in children. Special care may be needed. Overdosage: If you think you have taken too much of this medicine contact a poison control center or emergency room at  once. NOTE: This medicine is only for you. Do not share this medicine with others. What if I miss a dose? If you miss a dose, take it as soon as you can. If it is almost time for your next dose, take only that dose. Do not take double or extra doses. What may interact with this medicine? Do not take this medicine with any of the following medications: -metyrapone -mifepristone This medicine may also interact with the following medications: -aminoglutethimide -amphotericin B -aspirin and aspirin-like medicines -barbiturates -certain medicines for diabetes, like glipizide or glyburide -cholestyramine -cholinesterase inhibitors -cyclosporine -digoxin -diuretics -ephedrine -male hormones, like estrogens and birth control pills -isoniazid -ketoconazole -NSAIDS, medicines for pain and inflammation, like ibuprofen or naproxen -phenytoin -rifampin -toxoids -vaccines -warfarin This list may not describe all possible interactions. Give your health care provider a list of all the medicines, herbs, non-prescription drugs, or dietary supplements you use. Also tell them if you smoke, drink alcohol, or use illegal drugs. Some items may interact with your medicine. What should I watch for while using this medicine? Visit your doctor or health care professional for regular checks on your progress. If you are taking this medicine over a prolonged period, carry an identification card with your name and address, the type and dose of your medicine, and your doctor's name and address. This medicine may increase your risk of getting an infection. Tell your doctor or health care professional if you are around anyone with measles or chickenpox, or if you develop sores or blisters that do not heal properly. If you are going to have surgery, tell your doctor or health care professional that you have taken this medicine within the last twelve months. Ask your doctor or health care professional about your  diet. You may need to lower the amount of salt you eat. This medicine may affect blood sugar levels. If you have diabetes, check with your doctor or health care professional before you change your diet or the dose of your diabetic medicine. What side effects may I notice from receiving this medicine? Side effects that you should report to your doctor or health care professional as soon as possible: -allergic reactions like skin rash, itching or hives, swelling of the face, lips, or tongue -changes in emotions or moods -changes in vision -eye pain -signs and symptoms of high blood sugar such as dizziness; dry mouth; dry skin; fruity breath; nausea; stomach pain; increased hunger or thirst; increased urination -signs and symptoms of infection  like fever or chills; cough; sore throat; pain or trouble passing urine -slow growth in children (if used for longer periods of time) -swelling of ankles, feet -trouble sleeping -unusually weak or tired -weak bones (if used for longer periods of time) Side effects that usually do not require medical attention (report to your doctor or health care professional if they continue or are bothersome): -increased hunger -nausea -skin problems, acne, thin and shiny skin -upset stomach -weight gain This list may not describe all possible side effects. Call your doctor for medical advice about side effects. You may report side effects to FDA at 1-800-FDA-1088. Where should I keep my medicine? Keep out of the reach of children. Store at room temperature between 15 and 30 degrees C (59 and 86 degrees F). Keep container tightly closed. Throw away any unused medicine after the expiration date. NOTE: This sheet is a summary. It may not cover all possible information. If you have questions about this medicine, talk to your doctor, pharmacist, or health care provider.  2018 Elsevier/Gold Standard (2015-05-17 12:30:30)

## 2018-03-20 ENCOUNTER — Ambulatory Visit
Admission: RE | Admit: 2018-03-20 | Discharge: 2018-03-20 | Disposition: A | Discharge status: Home / Self Care | Payer: BLUE CROSS/BLUE SHIELD | Source: Ambulatory Visit | Attending: Adult Health | Admitting: Adult Health

## 2018-03-20 DIAGNOSIS — R05 Cough: Secondary | ICD-10-CM | POA: Diagnosis not present

## 2018-03-20 DIAGNOSIS — J40 Bronchitis, not specified as acute or chronic: Secondary | ICD-10-CM | POA: Diagnosis present

## 2018-03-20 DIAGNOSIS — R059 Cough, unspecified: Secondary | ICD-10-CM

## 2018-03-22 NOTE — Progress Notes (Signed)
Chest x ray normal. Patient was advised would only call if abnormal. He is to follow up as directed at office visit .

## 2018-03-23 ENCOUNTER — Telehealth: Payer: Self-pay

## 2018-03-23 NOTE — Telephone Encounter (Signed)
Pt called to inquire about chest xray from last week; per M. Flinchum's note, chest xray was normal; pt aware and verb u/o

## 2019-02-16 ENCOUNTER — Other Ambulatory Visit: Payer: Self-pay

## 2019-02-16 ENCOUNTER — Ambulatory Visit: Payer: Self-pay

## 2019-02-16 DIAGNOSIS — Z23 Encounter for immunization: Secondary | ICD-10-CM

## 2019-03-17 ENCOUNTER — Ambulatory Visit: Payer: BLUE CROSS/BLUE SHIELD | Admitting: Urology

## 2019-03-21 ENCOUNTER — Encounter: Payer: Self-pay | Admitting: Urology

## 2019-03-21 ENCOUNTER — Other Ambulatory Visit: Payer: Self-pay

## 2019-03-21 ENCOUNTER — Ambulatory Visit: Payer: BC Managed Care – PPO | Admitting: Urology

## 2019-03-21 VITALS — BP 150/82 | HR 70 | Ht 72.0 in | Wt 214.4 lb

## 2019-03-21 DIAGNOSIS — R972 Elevated prostate specific antigen [PSA]: Secondary | ICD-10-CM | POA: Diagnosis not present

## 2019-03-21 NOTE — Progress Notes (Signed)
   03/21/2019 10:14 AM   Thomas Hoffman 06/09/1955 [id number]  Reason for visit: Elevated PSA  HPI: I saw Thomas Hoffman in urology clinic today for elevated PSA.  He was previously followed in our clinic by Dr. Tresa Moore for history of nephrolithiasis and routine PSA screening, and was last seen in December 2017.  He was recently found to have a mildly elevated PSA of 5.9 which had increased slightly from 3.46 prior, and 2.15 the year before that.  He has never undergone prostate biopsy previously.  He denies any family history of prostate or breast cancer.  His past surgical history is notable for an open appendectomy when he was in college.  He denies any urinary symptoms.  He is otherwise very healthy and does not take medications regularly.  He has a history of stones treated with ureteroscopy in [place] in the past, but denies any recent issues.  He denies any weight loss, gross hematuria, or bone pain.  He works as a Water engineer at [employer].  ROS: Please see flowsheet from today's date for complete review of systems.  Physical Exam: BP (!) 150/82 (BP Location: Left Arm, Patient Position: Sitting, Cuff Size: Normal)   Pulse 70   Ht 6' (1.829 m)   Wt 214 lb 6.4 oz (97.3 kg)   BMI 29.08 kg/m    Constitutional:  Alert and oriented, No acute distress. Respiratory: Normal respiratory effort, no increased work of breathing. GI: Abdomen is soft, nontender, nondistended, no abdominal masses DRE: 30g, smooth, no nodules or masses Skin: No rashes, bruises or suspicious lesions. Neurologic: Grossly intact, no focal deficits, moving all 4 extremities. Psychiatric: Normal mood and affect  Laboratory Data: PSA history 10/2014: 2.42 10/2015: 1.34 12/2017: 2.15 12/2018: 3.46 01/2019: 5.92  Pertinent Imaging: None to review  Assessment & Plan:   In summary, he is a 63 year old healthy male with a mildly elevated PSA of 5.92, normal DRE, no family history of prostate cancer.  We  reviewed the implications of an elevated PSA and the uncertainty surrounding it. In general, a man's PSA increases with age and is produced by both normal and cancerous prostate tissue. The differential diagnosis for elevated PSA includes BPH, prostate cancer, infection, recent intercourse/ejaculation, recent urethroscopic manipulation (foley placement/cystoscopy) or trauma, and prostatitis.   Management of an elevated PSA can include observation or prostate biopsy and we discussed this in detail. Our goal is to detect clinically significant prostate cancers, and manage with either active surveillance, surgery, or radiation for localized disease. Risks of prostate biopsy include bleeding, infection (including life threatening sepsis), pain, and lower urinary symptoms. Hematuria, hematospermia, and blood in the stool are all common after biopsy and can persist up to 4 weeks.   Repeat PSA today with free/total ratio, call with results Pursue prostate biopsy if PSA remains elevated  A total of 25 minutes were spent face-to-face with the patient, greater than 50% was spent in patient education, counseling, and coordination of care regarding PSA screening and prostate biopsy.  Billey Co, [facility] [address], [place], [place] [address] [phone]

## 2019-03-21 NOTE — Patient Instructions (Signed)

## 2019-03-22 ENCOUNTER — Telehealth: Payer: Self-pay

## 2019-03-22 DIAGNOSIS — R972 Elevated prostate specific antigen [PSA]: Secondary | ICD-10-CM

## 2019-03-22 LAB — PSA, TOTAL AND FREE
PSA, Free Pct: 23.3 %
PSA, Free: 0.49 ng/mL
Prostate Specific Ag, Serum: 2.1 ng/mL (ref 0.0–4.0)

## 2019-03-22 NOTE — Telephone Encounter (Signed)
-----   Message from Billey Co, MD sent at 03/22/2019  7:56 AM EST ----- Doristine Devoid news, PSA back down to normal and his baseline of 2.1. No need for prostate biopsy.   Schedule RTC one year with PSA reflex to free prior, thanks  Nickolas Madrid, MD 03/22/2019

## 2019-03-22 NOTE — Telephone Encounter (Signed)
Called pt no answer. Left detailed message informing pt of information below. Appt scheduled, lab entered.

## 2019-10-05 ENCOUNTER — Encounter: Payer: Self-pay | Admitting: Podiatry

## 2019-10-05 ENCOUNTER — Ambulatory Visit (INDEPENDENT_AMBULATORY_CARE_PROVIDER_SITE_OTHER): Payer: BC Managed Care – PPO

## 2019-10-05 ENCOUNTER — Other Ambulatory Visit: Payer: Self-pay

## 2019-10-05 ENCOUNTER — Ambulatory Visit: Payer: Self-pay | Admitting: Podiatry

## 2019-10-05 DIAGNOSIS — M2012 Hallux valgus (acquired), left foot: Secondary | ICD-10-CM

## 2019-10-05 NOTE — Patient Instructions (Signed)
Pre-Operative Instructions  Congratulations, you have decided to take an important step towards improving your quality of life.  You can be assured that the doctors and staff at [facility] will be with you every step of the way.  Here are some important things you should know:  1. Plan to be at the surgery center/hospital at least 1 (one) hour prior to your scheduled time, unless otherwise directed by the surgical center/hospital staff.  You must have a responsible adult accompany you, remain during the surgery and drive you home.  Make sure you have directions to the surgical center/hospital to ensure you arrive on time. 2. If you are having surgery at [facility] or [facility], you will need a copy of your medical history and physical form from your family physician within one month prior to the date of surgery. We will give you a form for your primary physician to complete.  3. We make every effort to accommodate the date you request for surgery.  However, there are times where surgery dates or times have to be moved.  We will contact you as soon as possible if a change in schedule is required.   4. No aspirin/ibuprofen for one week before surgery.  If you are on aspirin, any non-steroidal anti-inflammatory medications (Mobic, Aleve, Ibuprofen) should not be taken seven (7) days prior to your surgery.  You make take Tylenol for pain prior to surgery.  5. Medications - If you are taking daily heart and blood pressure medications, seizure, reflux, allergy, asthma, anxiety, pain or diabetes medications, make sure you notify the surgery center/hospital before the day of surgery so they can tell you which medications you should take or avoid the day of surgery. 6. No food or drink after midnight the night before surgery unless directed otherwise by surgical center/hospital staff. 7. No alcoholic beverages 24-hours prior to surgery.  No smoking 24-hours prior or 24-hours after  surgery. 8. Wear loose pants or shorts. They should be loose enough to fit over bandages, boots, and casts. 9. Don't wear slip-on shoes. Sneakers are preferred. 10. Bring your boot with you to the surgery center/hospital.  Also bring crutches or a Draughn if your physician has prescribed it for you.  If you do not have this equipment, it will be provided for you after surgery. 11. If you have not been contacted by the surgery center/hospital by the day before your surgery, call to confirm the date and time of your surgery. 12. Leave-time from work may vary depending on the type of surgery you have.  Appropriate arrangements should be made prior to surgery with your employer. 13. Prescriptions will be provided immediately following surgery by your doctor.  Fill these as soon as possible after surgery and take the medication as directed. Pain medications will not be refilled on weekends and must be approved by the doctor. 14. Remove nail polish on the operative foot and avoid getting pedicures prior to surgery. 15. Wash the night before surgery.  The night before surgery wash the foot and leg well with water and the antibacterial soap provided. Be sure to pay special attention to beneath the toenails and in between the toes.  Wash for at least three (3) minutes. Rinse thoroughly with water and dry well with a towel.  Perform this wash unless told not to do so by your physician.  Enclosed: 1 Ice pack (please put in freezer the night before surgery)   1 Hibiclens skin cleaner     Pre-op instructions  If you have any questions regarding the instructions, please do not hesitate to call our office.  [place]: [address], [place], [place] [address] -- [phone]  [place]: [address] [place], [place] [address] -- [phone]  [place]: [address], [place], [place] [address] -- [phone]   Website: [email or web address] 

## 2019-10-05 NOTE — Progress Notes (Signed)
Subjective:  Patient ID: Thomas Hoffman, male    DOB: 05/19/1955,  MRN: [id number] HPI Chief Complaint  Patient presents with  . Bunions    Patient presents today for bunion left hallux x years.  He denies any pain, but states "its starting to bother me when I wear my shoes"    64 y.o. male presents with the above complaint.   ROS: Denies fever chills nausea vomiting muscle aches pains calf pain back pain chest pain shortness of breath.  Past Medical History:  Diagnosis Date  . Elevated PSA      Current Outpatient Medications:  Marland Kitchen  Glucosamine 500 MG CAPS, Take by mouth., Disp: , Rfl:  .  aspirin EC 81 MG tablet, Take by mouth., Disp: , Rfl:  .  meloxicam (MOBIC) 15 MG tablet, Take 15 mg by mouth daily., Disp: , Rfl:  .  Multiple Vitamin (MULTI-VITAMINS) TABS, Take by mouth., Disp: , Rfl:  .  omeprazole (PRILOSEC) 40 MG capsule, Take by mouth., Disp: , Rfl:   No Known Allergies Review of Systems Objective:  There were no vitals filed for this visit.  General: Well developed, nourished, in no acute distress, alert and oriented x3   Dermatological: Skin is warm, dry and supple bilateral. Nails x 10 are well maintained; remaining integument appears unremarkable at this time. There are no open sores, no preulcerative lesions, no rash or signs of infection present.  Vascular: Dorsalis Pedis artery and Posterior Tibial artery pedal pulses are 2/4 bilateral with immedate capillary fill time. Pedal hair growth present. No varicosities and no lower extremity edema present bilateral.   Neruologic: Grossly intact via light touch bilateral. Vibratory intact via tuning fork bilateral. Protective threshold with Semmes Wienstein monofilament intact to all pedal sites bilateral. Patellar and Achilles deep tendon reflexes 2+ bilateral. No Babinski or clonus noted bilateral.   Musculoskeletal: No gross boney pedal deformities bilateral. No pain, crepitus, or limitation noted with foot and ankle  range of motion bilateral. Muscular strength 5/5 in all groups tested bilateral.  Hallux abductovalgus deformity with a large hypertrophic medial condyle.  Hallux abductus is minimal considering the large intermetatarsal angle.  There is no crepitation on range of motion and appears to be track bound rather than tracking.  Gait: Unassisted, Nonantalgic.    Radiographs:  Radiographs taken today demonstrate osseously mature individual with a significant hallux abductovalgus deformity of the left foot.  Significant increase in the first and metatarsal angle greater than normal value with hallux abductus angle greater than normal value.  Hallux interphalangeal angle is relatively normal.  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  Otherwise no significant abnormalities in particular no acute findings.  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  Plan: Discussed etiology pathology conservative versus surgical therapies.  At this point he would like to try surgical therapy consisting of an Austin type osteotomy.  I discussed this with him today in great detail he understands pros and cons of the surgery.  We did discuss the possible postop complications which may include but not limited to postop pain bleeding swelling infection recurrence need for further surgery overcorrection under correction loss of digit loss of limb loss of life he understands this is amenable to it we dispensed paperwork regarding the surgery center anesthesia group and instructions for the morning of surgery.  We also provided him with  a surgical boot should he have questions or concerns relating to any of this he will notify us immediately.  Otherwise we will follow-up with him in the fall for surgery.     Seretha Estabrooks T. [place], [place]

## 2019-10-06 ENCOUNTER — Telehealth: Payer: Self-pay | Admitting: Podiatry

## 2019-10-06 ENCOUNTER — Telehealth: Payer: Self-pay

## 2019-10-06 NOTE — Telephone Encounter (Signed)
Thank you and please make certain someone removes the boot from his bill.

## 2019-10-06 NOTE — Telephone Encounter (Signed)
Received surgery consent forms for patient. Left a message for Karla to call us to schedule surgery at his earliest convenience.

## 2019-10-06 NOTE — Telephone Encounter (Signed)
Patient came into the office and returned the AirWalker and bag. Patient stated that he has decided to delay surgery until a later time

## 2020-03-17 ENCOUNTER — Other Ambulatory Visit: Payer: BC Managed Care – PPO

## 2020-03-17 DIAGNOSIS — Z20822 Contact with and (suspected) exposure to covid-19: Secondary | ICD-10-CM

## 2020-03-18 LAB — NOVEL CORONAVIRUS, NAA: SARS-CoV-2, NAA: NOT DETECTED

## 2020-03-18 LAB — SARS-COV-2, NAA 2 DAY TAT

## 2020-03-28 ENCOUNTER — Ambulatory Visit: Payer: Self-pay | Admitting: Urology
